# Patient Record
Sex: Female | Born: 1995 | Race: Black or African American | Hispanic: No | Marital: Single | Smoking: Never smoker
Health system: Southern US, Community
[De-identification: ages and names within clinical notes are randomized; demographics above are authoritative.]

## PROBLEM LIST (undated history)

## (undated) ENCOUNTER — Inpatient Hospital Stay (HOSPITAL_COMMUNITY): Payer: Self-pay

## (undated) DIAGNOSIS — Z789 Other specified health status: Secondary | ICD-10-CM

## (undated) HISTORY — PX: PILONIDAL CYST DRAINAGE: SHX743

## (undated) HISTORY — PX: LEG SURGERY: SHX1003

---
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
## 2015-02-02 ENCOUNTER — Emergency Department (HOSPITAL_COMMUNITY)
Admission: EM | Admit: 2015-02-02 | Discharge: 2015-02-02 | Disposition: A | Discharge status: Home / Self Care | Payer: Medicaid Other | Attending: Emergency Medicine | Admitting: Emergency Medicine

## 2015-02-02 ENCOUNTER — Encounter (HOSPITAL_COMMUNITY): Payer: Self-pay | Admitting: Emergency Medicine

## 2015-02-02 DIAGNOSIS — Z79899 Other long term (current) drug therapy: Secondary | ICD-10-CM | POA: Insufficient documentation

## 2015-02-02 DIAGNOSIS — Z872 Personal history of diseases of the skin and subcutaneous tissue: Secondary | ICD-10-CM | POA: Insufficient documentation

## 2015-02-02 DIAGNOSIS — M545 Low back pain, unspecified: Secondary | ICD-10-CM

## 2015-02-02 MED ORDER — IBUPROFEN 800 MG PO TABS
800.0000 mg | ORAL_TABLET | Freq: Three times a day (TID) | ORAL | Status: DC
Start: 2015-02-02 — End: 2015-02-17

## 2015-02-02 NOTE — ED Notes (Signed)
Pt sts hx of cyst on back and having pain to the area x several days

## 2015-02-02 NOTE — Discharge Instructions (Signed)
You were seen in the ED today for evaluation of low back sensitivity to cold. We discussed your history of multiple surgeries for pilonidal cyst. You have no visible or palpable cyst on my exam. You do not have a fever or other indication for emergent imaging and surgery. I will give you two referrals today, [facility] to establish local primary care. I will also give you a referral [facility] to establish care given your history of recurrent pilonidal cysts. Please call both of them for follow-up within one week. Return to the ED for new or concerning symptoms.

## 2015-02-02 NOTE — ED Provider Notes (Signed)
CSN: [id number]     Arrival date & time 02/02/15  1103 History  By signing my name below, I, Jodi Gonzalez, attest that this documentation has been prepared under the direction and in the presence of Jodi PennerSerena Ayomide Purdy, PA-C Electronically Signed: Charline BillsEssence Gonzalez, ED Scribe 02/02/2015 at 11:35 AM.   Chief Complaint  Patient presents with  . Cyst  . Back Pain   The history is provided by the patient. No language interpreter was used.   HPI Comments: Jodi Gonzalez is a 19 y.o. female who presents to the Emergency Department complaining of a sensitive area to her back first noticed 1 week ago. Pt suspects that she has a pilonidal cyst. She reports h/o previous pilonidal cysts in the past which she has had lanced 5 times over the past few years. She reports that the area is sensitive to cold weather and palpation. Pt reports associated chills. She denies fever, nausea, vomiting, swelling, discharge, numbness or tingling in lower extremities.   History reviewed. No pertinent past medical history. History reviewed. No pertinent past surgical history. History reviewed. No pertinent family history. Social History  Substance Use Topics  . Smoking status: Never Smoker   . Smokeless tobacco: None  . Alcohol Use: No   OB History    Gravida Para Term Preterm AB TAB SAB Ectopic Multiple Living   0 0 0 0 0 0 0 0 0 0      Review of Systems  Constitutional: Positive for chills. Negative for fever.  Gastrointestinal: Negative for nausea and vomiting.  Musculoskeletal: Positive for back pain.  Neurological: Negative for numbness.  All other systems reviewed and are negative.  Allergies  Review of patient's allergies indicates no known allergies.  Home Medications   Prior to Admission medications   Medication Sig Start Date End Date Taking? Authorizing Provider  Norgestimate-Ethinyl Estradiol Triphasic (TRINESSA, 28,) 0.18/0.215/0.25 MG-35 MCG tablet Take 1 tablet by mouth daily.    Historical  Provider, MD   BP 121/71 mmHg  Pulse 66  Temp(Src) 98.3 F (36.8 C) (Oral)  Resp 18  SpO2 99% Physical Exam  Constitutional: She is oriented to person, place, and time. She appears well-developed and well-nourished. No distress.  HENT:  Head: Normocephalic and atraumatic.  Eyes: Conjunctivae and EOM are normal.  Neck: Neck supple. No tracheal deviation present.  Cardiovascular: Normal rate.   Pulmonary/Chest: Effort normal. No respiratory distress.  Musculoskeletal: Normal range of motion. She exhibits no edema or tenderness.  No visible or palpable masses.   Neurological: She is alert and oriented to person, place, and time.  Skin: Skin is warm and dry.  Psychiatric: She has a normal mood and affect. Her behavior is normal.  Nursing note and vitals reviewed.  ED Course  Procedures (including critical care time) DIAGNOSTIC STUDIES: Oxygen Saturation is 99% on RA, normal by my interpretation.    COORDINATION OF CARE: 11:23 AM-Discussed treatment plan which includes f/u with PCP with pt at bedside and pt agreed to plan.   Labs Review Labs Reviewed - No data to display  Imaging Review No results found. I have personally reviewed and evaluated these images and lab results as part of my medical decision-making.   EKG Interpretation None      MDM   Final diagnoses:  Midline low back pain without sciatica  History of pilonidal cyst    Pt with history of multiple pilonidal cysts requiring surgery, last surgery was one year ago. Reports one week of sensitivity to cold in  the area. Denies pain at rest. Minimal tenderness on exam. Pt is afebrile, nontoxic, very well appearing. No indication for further workup in the ED. Pt recently moved to the area for college. Will give referral to [facility] to establish primary care. Will also give referral to [facility], though at this time I discussed with pt that there is no urgency in seeing surgery as I have low suspicion for current pilonidal  cyst. I suspect she has some nerve involvement from prior cysts/surgeries and might benefit from gabapentin/lyrica. ER return precautions given.    I personally performed the services described in this documentation, which was scribed in my presence. The recorded information has been reviewed and is accurate.   Carlene Coria, PA-C 02/02/15 1147  Vanetta Mulders, MD 02/03/15 0730

## 2015-02-08 ENCOUNTER — Inpatient Hospital Stay (HOSPITAL_COMMUNITY)
Admission: AD | Admit: 2015-02-08 | Discharge: 2015-02-08 | Disposition: A | Discharge status: Home / Self Care | Payer: Self-pay | Source: Ambulatory Visit | Attending: Obstetrics and Gynecology | Admitting: Obstetrics and Gynecology

## 2015-02-08 ENCOUNTER — Encounter (HOSPITAL_COMMUNITY): Payer: Self-pay

## 2015-02-08 DIAGNOSIS — R1032 Left lower quadrant pain: Secondary | ICD-10-CM | POA: Insufficient documentation

## 2015-02-08 DIAGNOSIS — Z113 Encounter for screening for infections with a predominantly sexual mode of transmission: Secondary | ICD-10-CM | POA: Insufficient documentation

## 2015-02-08 LAB — CBC
HCT: 39.4 % (ref 36.0–46.0)
Hemoglobin: 13.4 g/dL (ref 12.0–15.0)
MCH: 30.4 pg (ref 26.0–34.0)
MCHC: 34 g/dL (ref 30.0–36.0)
MCV: 89.3 fL (ref 78.0–100.0)
PLATELETS: 278 10*3/uL (ref 150–400)
RBC: 4.41 MIL/uL (ref 3.87–5.11)
RDW: 12.2 % (ref 11.5–15.5)
WBC: 9.9 10*3/uL (ref 4.0–10.5)

## 2015-02-08 LAB — URINALYSIS, ROUTINE W REFLEX MICROSCOPIC
BILIRUBIN URINE: NEGATIVE
Glucose, UA: NEGATIVE mg/dL
Hgb urine dipstick: NEGATIVE
KETONES UR: NEGATIVE mg/dL
Leukocytes, UA: NEGATIVE
NITRITE: NEGATIVE
PROTEIN: NEGATIVE mg/dL
SPECIFIC GRAVITY, URINE: 1.025 (ref 1.005–1.030)
pH: 6.5 (ref 5.0–8.0)

## 2015-02-08 LAB — WET PREP, GENITAL
Clue Cells Wet Prep HPF POC: NONE SEEN
Sperm: NONE SEEN
TRICH WET PREP: NONE SEEN
YEAST WET PREP: NONE SEEN

## 2015-02-08 LAB — POCT PREGNANCY, URINE: PREG TEST UR: NEGATIVE

## 2015-02-08 NOTE — MAU Note (Signed)
Patient presents with c/o abdominal pain on left side for about a week now.

## 2015-02-08 NOTE — Discharge Instructions (Signed)

## 2015-02-08 NOTE — MAU Provider Note (Signed)
History     CSN: [id number]  Arrival date and time: 02/08/15 1439   First Provider Initiated Contact with Patient 02/08/15 1508         Chief Complaint  Patient presents with  . Abdominal Pain   Jodi Gonzalez is a 19 y.o. female who presents for abdominal pain.   Abdominal Pain This is a new problem. The current episode started in the past 7 days. The problem occurs intermittently. The problem has been unchanged. The pain is located in the LLQ. The pain is at a severity of 7/10 (currently no pain). The quality of the pain is aching and cramping. The abdominal pain does not radiate. Pertinent negatives include no anorexia, constipation, diarrhea, dysuria, fever, frequency, hematuria, nausea or vomiting. Associated symptoms comments: No vaginal bleeding or discharge. Nothing aggravates the pain. The pain is relieved by nothing. She has tried nothing for the symptoms. There is no history of abdominal surgery, Crohn's disease or irritable bowel syndrome.    LMP 12/8. Quit taking OCP 11/24 d/t side effects & desire for pregnancy.  Also requesting STD testing at this visit.   OB History    Gravida Para Term Preterm AB TAB SAB Ectopic Multiple Living        History reviewed. No pertinent past medical history.  Past Surgical History  Procedure Laterality Date  . Leg surgery      History reviewed. No pertinent family history.  Social History  Substance Use Topics  . Smoking status: Never Smoker   . Smokeless tobacco: None  . Alcohol Use: No    Allergies: No Known Allergies  Prescriptions prior to admission  Medication Sig Dispense Refill Last Dose  . ibuprofen (ADVIL,MOTRIN) 800 MG tablet Take 1 tablet (800 mg total) by mouth 3 (three) times daily. 21 tablet 0   . Norgestimate-Ethinyl Estradiol Triphasic (TRINESSA, 28,) 0.18/0.215/0.25 MG-35 MCG tablet Take 1 tablet by mouth daily.   10/20/2014 at Unknown time    Review of Systems  Constitutional:  Negative.  Negative for fever.  Gastrointestinal: Positive for abdominal pain. Negative for nausea, vomiting, diarrhea, constipation and anorexia.  Genitourinary: Negative.  Negative for dysuria, frequency and hematuria.   Physical Exam   Blood pressure 118/77, pulse 85, temperature 98.1 F (36.7 C), temperature source Oral, resp. rate 17, SpO2 99 %.  Physical Exam  Nursing note and vitals reviewed. Constitutional: She is oriented to person, place, and time. She appears well-developed and well-nourished. No distress.  HENT:  Head: Normocephalic and atraumatic.  Eyes: Conjunctivae are normal. Right eye exhibits no discharge. Left eye exhibits no discharge. No scleral icterus.  Neck: Normal range of motion.  Cardiovascular: Normal rate, regular rhythm and normal heart sounds.   No murmur heard. Respiratory: Effort normal and breath sounds normal. No respiratory distress. She has no wheezes.  GI: Soft. Bowel sounds are normal. She exhibits no distension and no mass. There is no tenderness. There is no rebound and no guarding.  Genitourinary: Vagina normal and uterus normal. Cervix exhibits discharge (small amount of thin white discharge). Cervix exhibits no motion tenderness and no friability. Right adnexum displays no mass, no tenderness and no fullness. Left adnexum displays no mass, no tenderness and no fullness.  Neurological: She is alert and oriented to person, place, and time.  Skin: Skin is warm and dry. She is not diaphoretic.  Psychiatric: She has a normal mood and affect. Her behavior is normal. Judgment and  thought content normal.    MAU Course  Procedures Results for orders placed or performed during the hospital encounter of 02/08/15 (from the past 24 hour(s))  Urinalysis, Routine w reflex microscopic (not at [facility])     Status: None   Collection Time: 02/08/15  2:39 PM  Result Value Ref Range   Color, Urine YELLOW YELLOW   APPearance CLEAR CLEAR   Specific Gravity, Urine  1.025 1.005 - 1.030   pH 6.5 5.0 - 8.0   Glucose, UA NEGATIVE NEGATIVE mg/dL   Hgb urine dipstick NEGATIVE NEGATIVE   Bilirubin Urine NEGATIVE NEGATIVE   Ketones, ur NEGATIVE NEGATIVE mg/dL   Protein, ur NEGATIVE NEGATIVE mg/dL   Nitrite NEGATIVE NEGATIVE   Leukocytes, UA NEGATIVE NEGATIVE  Pregnancy, urine POC     Status: None   Collection Time: 02/08/15  2:56 PM  Result Value Ref Range   Preg Test, Ur NEGATIVE NEGATIVE  CBC     Status: None   Collection Time: 02/08/15  3:26 PM  Result Value Ref Range   WBC 9.9 4.0 - 10.5 K/uL   RBC 4.41 3.87 - 5.11 MIL/uL   Hemoglobin 13.4 12.0 - 15.0 g/dL   HCT 82.939.4 56.236.0 - 13.046.0 %   MCV 89.3 78.0 - 100.0 fL   MCH 30.4 26.0 - 34.0 pg   MCHC 34.0 30.0 - 36.0 g/dL   RDW 86.512.2 78.411.5 - 69.615.5 %   Platelets 278 150 - 400 K/uL  Wet prep, genital     Status: Abnormal   Collection Time: 02/08/15  3:43 PM  Result Value Ref Range   Yeast Wet Prep HPF POC NONE SEEN NONE SEEN   Trich, Wet Prep NONE SEEN NONE SEEN   Clue Cells Wet Prep HPF POC NONE SEEN NONE SEEN   WBC, Wet Prep HPF POC MODERATE (A) NONE SEEN   Sperm NONE SEEN     MDM UPT negative STD screening per patient request Pt in no pain during MAU visit Assessment and Plan  A: 1. Left lower quadrant pain   2. Screen for STD (sexually transmitted disease)    P: Discharge home Take tylenol or motrin PRN pain If symptoms worsen go to urgent care or [facility] for [facility] care GC/CT, RPR, HIV pending   Judeth HornErin Lachell Rochette, NP  02/08/2015, 3:07 PM

## 2015-02-09 LAB — HIV ANTIBODY (ROUTINE TESTING W REFLEX): HIV SCREEN 4TH GENERATION: NONREACTIVE

## 2015-02-09 LAB — RPR: RPR Ser Ql: NONREACTIVE

## 2015-02-12 LAB — GC/CHLAMYDIA PROBE AMP (~~LOC~~) NOT AT ARMC
Chlamydia: NEGATIVE
NEISSERIA GONORRHEA: NEGATIVE

## 2015-02-17 ENCOUNTER — Encounter (HOSPITAL_COMMUNITY): Payer: Self-pay

## 2015-02-17 ENCOUNTER — Inpatient Hospital Stay (HOSPITAL_COMMUNITY)
Admission: AD | Admit: 2015-02-17 | Discharge: 2015-02-17 | Disposition: A | Discharge status: Home / Self Care | Payer: Medicaid Other | Source: Ambulatory Visit | Attending: Obstetrics & Gynecology | Admitting: Obstetrics & Gynecology

## 2015-02-17 DIAGNOSIS — Z3201 Encounter for pregnancy test, result positive: Secondary | ICD-10-CM | POA: Diagnosis present

## 2015-02-17 LAB — URINALYSIS, ROUTINE W REFLEX MICROSCOPIC
BILIRUBIN URINE: NEGATIVE
Glucose, UA: NEGATIVE mg/dL
Hgb urine dipstick: NEGATIVE
Ketones, ur: NEGATIVE mg/dL
LEUKOCYTES UA: NEGATIVE
NITRITE: NEGATIVE
PROTEIN: NEGATIVE mg/dL
Specific Gravity, Urine: 1.025 (ref 1.005–1.030)
pH: 6 (ref 5.0–8.0)

## 2015-02-17 LAB — POCT PREGNANCY, URINE: Preg Test, Ur: POSITIVE — AB

## 2015-02-17 MED ORDER — PRENATAL VITAMINS PLUS 27-1 MG PO TABS: 1.0000 | ORAL_TABLET | Freq: Every day | ORAL | Status: DC

## 2015-02-17 NOTE — MAU Note (Signed)
Was here a week ago, neg preg test, +HPT today.  Had some pinkish spotting on Monday.  Has been having cramping all week, feels like period is going to start

## 2015-02-17 NOTE — MAU Provider Note (Signed)
  History     CSN: [id number]  Arrival date and time: 02/17/15 1730   First Provider Initiated Contact with Patient 02/17/15 1831      Chief Complaint  Patient presents with  . Possible Pregnancy  . Abdominal Pain  . Vaginal Bleeding   HPI Jodi Gonzalez 20 y.o. G1P0000 @[redacted]w[redacted]d  presents for confirmation that everything is okay with the pregnancy.  She took a home pregnancy test today and it was positive. Last week, she had a test here that was negative.  (She came last week for cramping.)  She had a small amount of spotting one time on 02/15/15 and this concerns her now that she knows she is pregnant.  She reports she had IC the night before.  She denies fever, nausea, vomiting, dysuria, vaginal bleeding, discharge.  OB History    Gravida Para Term Preterm AB TAB SAB Ectopic Multiple Living   1 0 0 0 0 0 0 0 0 0       History reviewed. No pertinent past medical history.  Past Surgical History  Procedure Laterality Date  . Leg surgery    . Pilonidal cyst drainage      History reviewed. No pertinent family history.  Social History  Substance Use Topics  . Smoking status: Never Smoker   . Smokeless tobacco: None  . Alcohol Use: No    Allergies:  Allergies  Allergen Reactions  . Oatmeal Rash    Prescriptions prior to admission  Medication Sig Dispense Refill Last Dose  . Aspirin-Acetaminophen-Caffeine (PAMPRIN MAX PO) Take 2 tablets by mouth daily as needed (For cramps.).   02/16/2015 at Unknown time  . cycloSPORINE (RESTASIS) 0.05 % ophthalmic emulsion Place 1 drop into both eyes 2 (two) times daily.   Past Week at Unknown time  . ibuprofen (ADVIL,MOTRIN) 800 MG tablet Take 1 tablet (800 mg total) by mouth 3 (three) times daily. (Patient not taking: Reported on 02/08/2015) 21 tablet 0 Not Taking at Unknown time    ROS Pertinent ROS in HPI.  All other systems are negative.    Physical Exam   Blood pressure 138/82, pulse 70, temperature 97.8 F (36.6 C), temperature  source Oral, resp. rate 18, height 5' 2.75" (1.594 m), weight 172 lb (78.019 kg), last menstrual period 01/21/2015.  Physical Exam  Constitutional: She is oriented to person, place, and time. She appears well-developed and well-nourished. No distress.  HENT:  Head: Normocephalic and atraumatic.  Eyes: Conjunctivae and EOM are normal.  Neck: Normal range of motion. Neck supple.  Cardiovascular: Normal rate and normal heart sounds.   Respiratory: Effort normal. No respiratory distress.  GI: Soft. She exhibits no distension. There is no tenderness. There is no rebound and no guarding.  Neurological: She is alert and oriented to person, place, and time.  Skin: Skin is warm and dry.  Psychiatric: She has a normal mood and affect. Her behavior is normal.    MAU Course  Procedures  MDM Discussed with Dr. Despina HiddenEure whom agrees no further workup required.    Assessment and Plan  A:  1. Encounter for pregnancy test, result positive     P: Discharge to home [facility] verification letter given List of ob providers given OTC safe meds given Patient may return to MAU as needed or if her condition were to change or worsen   Bertram DenverKaren E Teague Clark 02/17/2015, 6:36 PM

## 2015-02-17 NOTE — Discharge Instructions (Signed)
Prenatal Care °WHAT IS PRENATAL CARE?  °Prenatal care is the process of caring for a pregnant woman before she gives birth. Prenatal care makes sure that she and her baby remain as healthy as possible throughout pregnancy. Prenatal care may be provided by a midwife, family practice health care provider, or a childbirth and pregnancy specialist (obstetrician). Prenatal care may include physical examinations, testing, treatments, and education on nutrition, lifestyle, and social support services. °WHY IS PRENATAL CARE SO IMPORTANT?  °Early and consistent prenatal care increases the chance that you and your baby will remain healthy throughout your pregnancy. This type of care also decreases a baby's risk of being born too early (prematurely), or being born smaller than expected (small for gestational age). Any underlying medical conditions you may have that could pose a risk during your pregnancy are discussed during prenatal care visits. You will also be monitored regularly for any new conditions that may arise during your pregnancy so they can be treated quickly and effectively. °WHAT HAPPENS DURING PRENATAL CARE VISITS? °Prenatal care visits may include the following: °Discussion °Tell your health care provider about any new signs or symptoms you have experienced since your last visit. These might include: °· Nausea or vomiting. °· Increased or decreased level of energy. °· Difficulty sleeping. °· Back or leg pain. °· Weight changes. °· Frequent urination. °· Shortness of breath with physical activity. °· Changes in your skin, such as the development of a rash or itchiness. °· Vaginal discharge or bleeding. °· Feelings of excitement or nervousness. °· Changes in your baby's movements. °You may want to write down any questions or topics you want to discuss with your health care provider and bring them with you to your appointment. °Examination °During your first prenatal care visit, you will likely have a complete  physical exam. Your health care provider will often examine your vagina, cervix, and the position of your uterus, as well as check your heart, lungs, and other body systems. As your pregnancy progresses, your health care provider will measure the size of your uterus and your baby's position inside your uterus. He or she may also examine you for early signs of labor. Your prenatal visits may also include checking your blood pressure and, after about 10-12 weeks of pregnancy, listening to your baby's heartbeat. °Testing °Regular testing often includes: °· Urinalysis. This checks your urine for glucose, protein, or signs of infection. °· Blood count. This checks the levels of white and red blood cells in your body. °· Tests for sexually transmitted infections (STIs). Testing for STIs at the beginning of pregnancy is routinely done and is required in many states. °· Antibody testing. You will be checked to see if you are immune to certain illnesses, such as rubella, that can affect a developing fetus. °· Glucose screen. Around 24-28 weeks of pregnancy, your blood glucose level will be checked for signs of gestational diabetes. Follow-up tests may be recommended. °· Group B strep. This is a bacteria that is commonly found inside a woman's vagina. This test will inform your health care provider if you need an antibiotic to reduce the amount of this bacteria in your body prior to labor and childbirth. °· Ultrasound. Many pregnant women undergo an ultrasound screening around 18-20 weeks of pregnancy to evaluate the health of the fetus and check for any developmental abnormalities. °· HIV (human immunodeficiency virus) testing. Early in your pregnancy, you will be screened for HIV. If you are at high risk for HIV, this test   may be repeated during your third trimester of pregnancy. °You may be offered other testing based on your age, personal or family medical history, or other factors.  °HOW OFTEN SHOULD I PLAN TO SEE MY  HEALTH CARE PROVIDER FOR PRENATAL CARE? °Your prenatal care check-up schedule depends on any medical conditions you have before, or develop during, your pregnancy. If you do not have any underlying medical conditions, you will likely be seen for checkups: °· Monthly, during the first 6 months of pregnancy. °· Twice a month during months 7 and 8 of pregnancy. °· Weekly starting in the 9th month of pregnancy and until delivery. °If you develop signs of early labor or other concerning signs or symptoms, you may need to see your health care provider more often. Ask your health care provider what prenatal care schedule is best for you. °WHAT CAN I DO TO KEEP MYSELF AND MY BABY AS HEALTHY AS POSSIBLE DURING MY PREGNANCY? °· Take a prenatal vitamin containing 400 micrograms (0.4 mg) of folic acid every day. Your health care provider may also ask you to take additional vitamins such as iodine, vitamin D, iron, copper, and zinc. °· Take 1500-2000 mg of calcium daily starting at your 20th week of pregnancy until you deliver your baby. °· Make sure you are up to date on your vaccinations. Unless directed otherwise by your health care provider: °¨ You should receive a tetanus, diphtheria, and pertussis (Tdap) vaccination between the 27th and 36th week of your pregnancy, regardless of when your last Tdap immunization occurred. This helps protect your baby from whooping cough (pertussis) after he or she is born. °¨ You should receive an annual inactivated influenza vaccine (IIV) to help protect you and your baby from influenza. This can be done at any point during your pregnancy. °· Eat a well-rounded diet that includes: °¨ Fresh fruits and vegetables. °¨ Lean proteins. °¨ Calcium-rich foods such as milk, yogurt, hard cheeses, and dark, leafy greens. °¨ Whole grain breads. °· Do not eat seafood high in mercury, including: °¨ Swordfish. °¨ Tilefish. °¨ Shark. °¨ King mackerel. °¨ More than 6 oz tuna per week. °· Do not eat: °¨ Raw  or undercooked meats or eggs. °¨ Unpasteurized foods, such as soft cheeses (brie, blue, or feta), juices, and milks. °¨ Lunch meats. °¨ Hot dogs that have not been heated until they are steaming. °· Drink enough water to keep your urine clear or pale yellow. For many women, this may be 10 or more 8 oz glasses of water each day. Keeping yourself hydrated helps deliver nutrients to your baby and may prevent the start of pre-term uterine contractions. °· Do not use any tobacco products including cigarettes, chewing tobacco, or electronic cigarettes. If you need help quitting, ask your health care provider. °· Do not drink beverages containing alcohol. No safe level of alcohol consumption during pregnancy has been determined. °· Do not use any illegal drugs. These can harm your developing baby or cause a miscarriage. °· Ask your health care provider or pharmacist before taking any prescription or over-the-counter medicines, herbs, or supplements. °· Limit your caffeine intake to no more than 200 mg per day. °· Exercise. Unless told otherwise by your health care provider, try to get 30 minutes of moderate exercise most days of the week. Do not  do high-impact activities, contact sports, or activities with a high risk of falling, such as horseback riding or downhill skiing. °· Get plenty of rest. °· Avoid anything that raises your   body temperature, such as hot tubs and saunas. °· If you own a cat, do not empty its litter box. Bacteria contained in cat feces can cause an infection called toxoplasmosis. This can result in serious harm to the fetus. °· Stay away from chemicals such as insecticides, lead, mercury, and cleaning or paint products that contain solvents. °· Do not have any X-rays taken unless medically necessary. °· Take a childbirth and breastfeeding preparation class. Ask your health care provider if you need a referral or recommendation. °  °This information is not intended to replace advice given to you by  your health care provider. Make sure you discuss any questions you have with your health care provider. °  °Document Released: 02/02/2003 Document Revised: 02/20/2014 Document Reviewed: 04/16/2013 °Elsevier Interactive Patient Education ©2016 [employer] ° °

## 2015-02-18 NOTE — MAU Note (Signed)
Proof of pregnancy letter for pt

## 2015-02-27 ENCOUNTER — Inpatient Hospital Stay (HOSPITAL_COMMUNITY): Payer: Medicaid Other

## 2015-02-27 ENCOUNTER — Encounter (HOSPITAL_COMMUNITY): Payer: Self-pay | Admitting: Student

## 2015-02-27 ENCOUNTER — Inpatient Hospital Stay (HOSPITAL_COMMUNITY)
Admission: AD | Admit: 2015-02-27 | Discharge: 2015-02-28 | Disposition: A | Discharge status: Home / Self Care | Payer: Medicaid Other | Source: Ambulatory Visit | Attending: Obstetrics & Gynecology | Admitting: Obstetrics & Gynecology

## 2015-02-27 DIAGNOSIS — Z3A01 Less than 8 weeks gestation of pregnancy: Secondary | ICD-10-CM | POA: Insufficient documentation

## 2015-02-27 DIAGNOSIS — O26899 Other specified pregnancy related conditions, unspecified trimester: Secondary | ICD-10-CM

## 2015-02-27 DIAGNOSIS — Z3491 Encounter for supervision of normal pregnancy, unspecified, first trimester: Secondary | ICD-10-CM

## 2015-02-27 DIAGNOSIS — O468X1 Other antepartum hemorrhage, first trimester: Secondary | ICD-10-CM

## 2015-02-27 DIAGNOSIS — R1030 Lower abdominal pain, unspecified: Secondary | ICD-10-CM | POA: Insufficient documentation

## 2015-02-27 DIAGNOSIS — O209 Hemorrhage in early pregnancy, unspecified: Secondary | ICD-10-CM | POA: Insufficient documentation

## 2015-02-27 DIAGNOSIS — R109 Unspecified abdominal pain: Secondary | ICD-10-CM

## 2015-02-27 DIAGNOSIS — O418X10 Other specified disorders of amniotic fluid and membranes, first trimester, not applicable or unspecified: Secondary | ICD-10-CM

## 2015-02-27 LAB — CBC
HCT: 40.8 % (ref 36.0–46.0)
Hemoglobin: 14 g/dL (ref 12.0–15.0)
MCH: 30.4 pg (ref 26.0–34.0)
MCHC: 34.3 g/dL (ref 30.0–36.0)
MCV: 88.7 fL (ref 78.0–100.0)
PLATELETS: 271 10*3/uL (ref 150–400)
RBC: 4.6 MIL/uL (ref 3.87–5.11)
RDW: 12.5 % (ref 11.5–15.5)
WBC: 13.2 10*3/uL — ABNORMAL HIGH (ref 4.0–10.5)

## 2015-02-27 LAB — HCG, QUANTITATIVE, PREGNANCY: HCG, BETA CHAIN, QUANT, S: 22198 m[IU]/mL — AB (ref <5)

## 2015-02-27 NOTE — MAU Note (Signed)
Having vag bleeding like a period about an hour ago. Mild cramping.

## 2015-02-27 NOTE — MAU Provider Note (Signed)
History     CSN: [id number]  Arrival date and time: 02/27/15 2223   First Provider Initiated Contact with Patient 02/27/15 2357       Chief Complaint  Patient presents with  . Abdominal Cramping  . Vaginal Bleeding   HPI  Jodi Gonzalez is a 20 y.o. G1P0000 at [redacted]w[redacted]d who presents with vaginal bleeding & abdominal cramping.  Symptoms started tonight. Reports episode of gush of bright red blood. Bleeding has decreased by continued. No clots. Lower abdominal pain is intermittent. Rates 5/10. No treatment.  Denies n/v/d, constipation, vaginal discharge, urinary complaints.  Last intercourse was 2 days ago.   OB History    Gravida Para Term Preterm AB TAB SAB Ectopic Multiple Living   1 0 0 0 0 0 0 0 0 0       No past medical history on file.  Past Surgical History  Procedure Laterality Date  . Leg surgery    . Pilonidal cyst drainage      No family history on file.  Social History  Substance Use Topics  . Smoking status: Never Smoker   . Smokeless tobacco: Not on file  . Alcohol Use: No    Allergies:  Allergies  Allergen Reactions  . Oatmeal Rash    Prescriptions prior to admission  Medication Sig Dispense Refill Last Dose  . Prenatal Vit-Fe Fumarate-FA (PRENATAL VITAMINS PLUS) 27-1 MG TABS Take 1 tablet by mouth daily. 30 tablet 11     Review of Systems  Constitutional: Negative.   Gastrointestinal: Positive for abdominal pain. Negative for nausea, vomiting, diarrhea and constipation.  Genitourinary: Negative for dysuria.       + vaginal bleeding   Physical Exam   Blood pressure 125/71, pulse 76, temperature 97.6 F (36.4 C), resp. rate 18, height 5\' 3"  (1.6 m), weight 175 lb 9.6 oz (79.652 kg), last menstrual period 01/21/2015.  Physical Exam  Nursing note and vitals reviewed. Constitutional: She is oriented to person, place, and time. She appears well-developed and well-nourished. No distress.  HENT:  Head: Normocephalic and atraumatic.  Eyes:  Conjunctivae are normal. Right eye exhibits no discharge. Left eye exhibits no discharge. No scleral icterus.  Neck: Normal range of motion.  Cardiovascular: Normal rate, regular rhythm and normal heart sounds.   No murmur heard. Respiratory: Effort normal and breath sounds normal. No respiratory distress. She has no wheezes.  GI: Soft. Bowel sounds are normal. She exhibits no distension. There is no tenderness.  Genitourinary: Vagina normal and uterus normal. Right adnexum displays no mass and no tenderness. Left adnexum displays no mass and no tenderness.  Small amount of dark red blood Cervix closed  Neurological: She is alert and oriented to person, place, and time.  Skin: Skin is warm and dry. She is not diaphoretic.  Psychiatric: She has a normal mood and affect. Her behavior is normal. Judgment and thought content normal.    MAU Course  Procedures Results for orders placed or performed during the hospital encounter of 02/27/15 (from the past 24 hour(s))  CBC     Status: Abnormal   Collection Time: 02/27/15 10:38 PM  Result Value Ref Range   WBC 13.2 (H) 4.0 - 10.5 K/uL   RBC 4.60 3.87 - 5.11 MIL/uL   Hemoglobin 14.0 12.0 - 15.0 g/dL   HCT 40.9 81.1 - 91.4 %   MCV 88.7 78.0 - 100.0 fL   MCH 30.4 26.0 - 34.0 pg   MCHC 34.3 30.0 - 36.0 g/dL  RDW 12.5 11.5 - 15.5 %   Platelets 271 150 - 400 K/uL  ABO/Rh     Status: None   Collection Time: 02/27/15 10:38 PM  Result Value Ref Range   ABO/RH(D) O POS   hCG, quantitative, pregnancy     Status: Abnormal   Collection Time: 02/27/15 10:38 PM  Result Value Ref Range   hCG, Beta Chain, Quant, S 22198 (H) <5 mIU/mL  Wet prep, genital     Status: Abnormal   Collection Time: 02/28/15 12:20 AM  Result Value Ref Range   Yeast Wet Prep HPF POC NONE SEEN NONE SEEN   Trich, Wet Prep NONE SEEN NONE SEEN   Clue Cells Wet Prep HPF POC NONE SEEN NONE SEEN   WBC, Wet Prep HPF POC FEW (A) NONE SEEN   Sperm NONE SEEN    US Ob Comp Less 14  Wks  02/27/2015  CLINICAL DATA:  Pregnancy, abdominal pain, bleeding, cramping ; quantitative beta HCG not currently available for correlation EXAM: OBSTETRIC <14 WK [place] AND TRANSVAGINAL OB US TECHNIQUE: Both transabdominal and transvaginal ultrasound examinations were performed for complete evaluation of the gestation as well as the maternal uterus, adnexal regions, and pelvic cul-de-sac. Transvaginal technique was performed to assess early pregnancy. COMPARISON:  None FINDINGS: Intrauterine gestational sac: Visualized/normal in shape. Yolk sac:  Present Embryo:  Present Cardiac Activity: Present Heart Rate: 106  bpm MSD:   mm    w     d CRL:  4.3  mm   6 w   1 d                  [place] EDC: 10/22/2015 Subchorionic hemorrhage: Present, 2 foci measuring 13 x 5 x 8 mm and 15 x 10 x 15 mm. Maternal uterus/adnexae: No free pelvic fluid. RIGHT ovary normal size and morphology 3.9 x 2.9 x 2.2 cm. LEFT ovary normal size and morphology 3.4 x 2.2 x 1.7 cm. No adnexal masses. IMPRESSION: Single live intrauterine gestation measured at 6 weeks 1 day EGA by crown-rump length. Two foci of subchorionic hemorrhage are identified as above. Electronically Signed   By: Ulyses Southward M.D.   On: 02/27/2015 23:32   US Ob Transvaginal  02/27/2015  CLINICAL DATA:  Pregnancy, abdominal pain, bleeding, cramping ; quantitative beta HCG not currently available for correlation EXAM: OBSTETRIC <14 WK [place] AND TRANSVAGINAL OB US TECHNIQUE: Both transabdominal and transvaginal ultrasound examinations were performed for complete evaluation of the gestation as well as the maternal uterus, adnexal regions, and pelvic cul-de-sac. Transvaginal technique was performed to assess early pregnancy. COMPARISON:  None FINDINGS: Intrauterine gestational sac: Visualized/normal in shape. Yolk sac:  Present Embryo:  Present Cardiac Activity: Present Heart Rate: 106  bpm MSD:   mm    w     d CRL:  4.3  mm   6 w   1 d                  [place] EDC: 10/22/2015 Subchorionic  hemorrhage: Present, 2 foci measuring 13 x 5 x 8 mm and 15 x 10 x 15 mm. Maternal uterus/adnexae: No free pelvic fluid. RIGHT ovary normal size and morphology 3.9 x 2.9 x 2.2 cm. LEFT ovary normal size and morphology 3.4 x 2.2 x 1.7 cm. No adnexal masses. IMPRESSION: Single live intrauterine gestation measured at 6 weeks 1 day EGA by crown-rump length. Two foci of subchorionic hemorrhage are identified as above. Electronically Signed   By: Loraine Leriche  Tyron RussellBoles M.D.   On: 02/27/2015 23:32    MDM O positive Ultrasound show [facility] with cardiac [facility]  Assessment and Plan  A: 1. Normal IUP (intrauterine pregnancy) on prenatal ultrasound, first trimester   2. Abdominal pain in pregnancy   3. Subchorionic hematoma in first trimester    P: Discharge home Start prenatal care Pelvic rest while bleeding Discussed reasons to return  Judeth HornErin Teosha Casso, NP  02/27/2015, 11:57 PM

## 2015-02-28 ENCOUNTER — Encounter (HOSPITAL_COMMUNITY): Payer: Self-pay | Admitting: *Deleted

## 2015-02-28 DIAGNOSIS — Z3491 Encounter for supervision of normal pregnancy, unspecified, first trimester: Secondary | ICD-10-CM

## 2015-02-28 LAB — HIV ANTIBODY (ROUTINE TESTING W REFLEX): HIV SCREEN 4TH GENERATION: NONREACTIVE

## 2015-02-28 LAB — WET PREP, GENITAL
CLUE CELLS WET PREP: NONE SEEN
Sperm: NONE SEEN
Trich, Wet Prep: NONE SEEN
Yeast Wet Prep HPF POC: NONE SEEN

## 2015-02-28 LAB — ABO/RH: ABO/RH(D): O POS

## 2015-02-28 MED ORDER — PRENATAL VITAMIN 27-0.8 MG PO TABS: 1.0000 | ORAL_TABLET | Freq: Every day | ORAL | Status: DC

## 2015-02-28 NOTE — Progress Notes (Signed)
Written and verbal d/c instructions given and understanding voiced. 

## 2015-02-28 NOTE — Discharge Instructions (Signed)
Subchorionic Hematoma A subchorionic hematoma is a gathering of blood between the outer wall of the placenta and the inner wall of the womb (uterus). The placenta is the organ that connects the fetus to the wall of the uterus. The placenta performs the feeding, breathing (oxygen to the fetus), and waste removal (excretory work) of the fetus.  Subchorionic hematoma is the most common abnormality found on a result from ultrasonography done during the first trimester or early second trimester of pregnancy. If there has been little or no vaginal bleeding, early small hematomas usually shrink on their own and do not affect your baby or pregnancy. The blood is gradually absorbed over 1-2 weeks. When bleeding starts later in pregnancy or the hematoma is larger or occurs in an older pregnant woman, the outcome may not be as good. Larger hematomas may get bigger, which increases the chances for miscarriage. Subchorionic hematoma also increases the risk of premature detachment of the placenta from the uterus, preterm (premature) labor, and stillbirth. HOME CARE INSTRUCTIONS  Stay on bed rest if your health care provider recommends this. Although bed rest will not prevent more bleeding or prevent a miscarriage, your health care provider may recommend bed rest until you are advised otherwise.  Avoid heavy lifting (more than 10 lb [4.5 kg]), exercise, sexual intercourse, or douching as directed by your health care provider.  Keep track of the number of pads you use each day and how soaked (saturated) they are. Write down this information.  Do not use tampons.  Keep all follow-up appointments as directed by your health care provider. Your health care provider may ask you to have follow-up blood tests or ultrasound tests or both. SEEK IMMEDIATE MEDICAL CARE IF:  You have severe cramps in your stomach, back, abdomen, or pelvis.  You have a fever.  You pass large clots or tissue. Save any tissue for your health  care provider to look at.  Your bleeding increases or you become lightheaded, feel weak, or have fainting episodes.   This information is not intended to replace advice given to you by your health care provider. Make sure you discuss any questions you have with your health care provider.   Document Released: 05/17/2006 Document Revised: 02/20/2014 Document Reviewed: 08/29/2012 Elsevier Interactive Patient Education 2016 [employer] First Trimester of Pregnancy The first trimester of pregnancy is from week 1 until the end of week 12 (months 1 through 3). A week after a sperm fertilizes an egg, the egg will implant on the wall of the uterus. This embryo will begin to develop into a baby. Genes from you and your partner are forming the baby. The female genes determine whether the baby is a boy or a girl. At 6-8 weeks, the eyes and face are formed, and the heartbeat can be seen on ultrasound. At the end of 12 weeks, all the baby's organs are formed.  Now that you are pregnant, you will want to do everything you can to have a healthy baby. Two of the most important things are to get good prenatal care and to follow your health care provider's instructions. Prenatal care is all the medical care you receive before the baby's birth. This care will help prevent, find, and treat any problems during the pregnancy and childbirth. BODY CHANGES Your body goes through many changes during pregnancy. The changes vary from woman to woman.   You may gain or lose a couple of pounds at first.  You may feel sick to your   stomach (nauseous) and throw up (vomit). If the vomiting is uncontrollable, call your health care provider.  You may tire easily.  You may develop headaches that can be relieved by medicines approved by your health care provider.  You may urinate more often. Painful urination may mean you have a bladder infection.  You may develop heartburn as a result of your pregnancy.  You may develop  constipation because certain hormones are causing the muscles that push waste through your intestines to slow down.  You may develop hemorrhoids or swollen, bulging veins (varicose veins).  Your breasts may begin to grow larger and become tender. Your nipples may stick out more, and the tissue that surrounds them (areola) may become darker.  Your gums may bleed and may be sensitive to brushing and flossing.  Dark spots or blotches (chloasma, mask of pregnancy) may develop on your face. This will likely fade after the baby is born.  Your menstrual periods will stop.  You may have a loss of appetite.  You may develop cravings for certain kinds of food.  You may have changes in your emotions from day to day, such as being excited to be pregnant or being concerned that something may go wrong with the pregnancy and baby.  You may have more vivid and strange dreams.  You may have changes in your hair. These can include thickening of your hair, rapid growth, and changes in texture. Some women also have hair loss during or after pregnancy, or hair that feels dry or thin. Your hair will most likely return to normal after your baby is born. WHAT TO EXPECT AT YOUR PRENATAL VISITS During a routine prenatal visit:  You will be weighed to make sure you and the baby are growing normally.  Your blood pressure will be taken.  Your abdomen will be measured to track your baby's growth.  The fetal heartbeat will be listened to starting around week 10 or 12 of your pregnancy.  Test results from any previous visits will be discussed. Your health care provider may ask you:  How you are feeling.  If you are feeling the baby move.  If you have had any abnormal symptoms, such as leaking fluid, bleeding, severe headaches, or abdominal cramping.  If you are using any tobacco products, including cigarettes, chewing tobacco, and electronic cigarettes.  If you have any questions. Other tests that may be  performed during your first trimester include:  Blood tests to find your blood type and to check for the presence of any previous infections. They will also be used to check for low iron levels (anemia) and Rh antibodies. Later in the pregnancy, blood tests for diabetes will be done along with other tests if problems develop.  Urine tests to check for infections, diabetes, or protein in the urine.  An ultrasound to confirm the proper growth and development of the baby.  An amniocentesis to check for possible genetic problems.  Fetal screens for spina bifida and Down syndrome.  You may need other tests to make sure you and the baby are doing well.  HIV (human immunodeficiency virus) testing. Routine prenatal testing includes screening for HIV, unless you choose not to have this test. HOME CARE INSTRUCTIONS  Medicines  Follow your health care provider's instructions regarding medicine use. Specific medicines may be either safe or unsafe to take during pregnancy.  Take your prenatal vitamins as directed.  If you develop constipation, try taking a stool softener if your health care provider   health care provider approves. Diet  Eat regular, well-balanced meals. Choose a variety of foods, such as meat or vegetable-based protein, fish, milk and low-fat dairy products, vegetables, fruits, and whole grain breads and cereals. Your health care provider will help you determine the amount of weight gain that is right for you.  Avoid raw meat and uncooked cheese. These carry germs that can cause birth defects in the baby.  Eating four or five small meals rather than three large meals a day may help relieve nausea and vomiting. If you start to feel nauseous, eating a few soda crackers can be helpful. Drinking liquids between meals instead of during meals also seems to help nausea and vomiting.  If you develop constipation, eat more high-fiber foods, such as fresh vegetables or fruit and whole grains. Drink enough fluids  to keep your urine clear or pale yellow. Activity and Exercise  Exercise only as directed by your health care provider. Exercising will help you:  Control your weight.  Stay in shape.  Be prepared for labor and delivery.  Experiencing pain or cramping in the lower abdomen or low back is a good sign that you should stop exercising. Check with your health care provider before continuing normal exercises.  Try to avoid standing for long periods of time. Move your legs often if you must stand in one place for a long time.  Avoid heavy lifting.  Wear low-heeled shoes, and practice good posture.  You may continue to have sex unless your health care provider directs you otherwise. Relief of Pain or Discomfort  Wear a good support bra for breast tenderness.   Take warm sitz baths to soothe any pain or discomfort caused by hemorrhoids. Use hemorrhoid cream if your health care provider approves.   Rest with your legs elevated if you have leg cramps or low back pain.  If you develop varicose veins in your legs, wear support hose. Elevate your feet for 15 minutes, 3-4 times a day. Limit salt in your diet. Prenatal Care  Schedule your prenatal visits by the twelfth week of pregnancy. They are usually scheduled monthly at first, then more often in the last 2 months before delivery.  Write down your questions. Take them to your prenatal visits.  Keep all your prenatal visits as directed by your health care provider. Safety  Wear your seat belt at all times when driving.  Make a list of emergency phone numbers, including numbers for family, friends, the hospital, and police and fire departments. General Tips  Ask your health care provider for a referral to a local prenatal education class. Begin classes no later than at the beginning of month 6 of your pregnancy.  Ask for help if you have counseling or nutritional needs during pregnancy. Your health care provider can offer advice or  refer you to specialists for help with various needs.  Do not use hot tubs, steam rooms, or saunas.  Do not douche or use tampons or scented sanitary pads.  Do not cross your legs for long periods of time.  Avoid cat litter boxes and soil used by cats. These carry germs that can cause birth defects in the baby and possibly loss of the fetus by miscarriage or stillbirth.  Avoid all smoking, herbs, alcohol, and medicines not prescribed by your health care provider. Chemicals in these affect the formation and growth of the baby.  Do not use any tobacco products, including cigarettes, chewing tobacco, and electronic cigarettes. If you need help  health care provider. You may receive counseling support and other resources to help you quit.  Schedule a dentist appointment. At home, brush your teeth with a soft toothbrush and be gentle when you floss. SEEK MEDICAL CARE IF:   You have dizziness.  You have mild pelvic cramps, pelvic pressure, or nagging pain in the abdominal area.  You have persistent nausea, vomiting, or diarrhea.  You have a bad smelling vaginal discharge.  You have pain with urination.  You notice increased swelling in your face, hands, legs, or ankles. SEEK IMMEDIATE MEDICAL CARE IF:   You have a fever.  You are leaking fluid from your vagina.  You have spotting or bleeding from your vagina.  You have severe abdominal cramping or pain.  You have rapid weight gain or loss.  You vomit blood or material that looks like coffee grounds.  You are exposed to German measles and have never had them.  You are exposed to fifth disease or chickenpox.  You develop a severe headache.  You have shortness of breath.  You have any kind of trauma, such as from a fall or a car accident.   This information is not intended to replace advice given to you by your health care provider. Make sure you discuss any questions you have with your health care  provider.   Document Released: 01/24/2001 Document Revised: 02/20/2014 Document Reviewed: 12/10/2012 Elsevier Interactive Patient Education 2016 [employer] Inc[employer]  

## 2015-03-01 LAB — GC/CHLAMYDIA PROBE AMP (~~LOC~~) NOT AT ARMC
Chlamydia: NEGATIVE
Neisseria Gonorrhea: NEGATIVE

## 2015-03-18 ENCOUNTER — Other Ambulatory Visit (HOSPITAL_COMMUNITY): Payer: Self-pay | Admitting: Nurse Practitioner

## 2015-03-18 DIAGNOSIS — Z3682 Encounter for antenatal screening for nuchal translucency: Secondary | ICD-10-CM

## 2015-03-18 DIAGNOSIS — Z3A13 13 weeks gestation of pregnancy: Secondary | ICD-10-CM

## 2015-04-07 ENCOUNTER — Encounter (HOSPITAL_COMMUNITY): Payer: Self-pay | Admitting: Obstetrics & Gynecology

## 2015-04-15 ENCOUNTER — Inpatient Hospital Stay (HOSPITAL_COMMUNITY)
Admission: AD | Admit: 2015-04-15 | Discharge: 2015-04-15 | Disposition: A | Discharge status: Home / Self Care | Payer: Medicaid Other | Source: Ambulatory Visit | Attending: Obstetrics & Gynecology | Admitting: Obstetrics & Gynecology

## 2015-04-15 DIAGNOSIS — O219 Vomiting of pregnancy, unspecified: Secondary | ICD-10-CM | POA: Diagnosis not present

## 2015-04-15 DIAGNOSIS — O210 Mild hyperemesis gravidarum: Secondary | ICD-10-CM | POA: Insufficient documentation

## 2015-04-15 DIAGNOSIS — R110 Nausea: Secondary | ICD-10-CM

## 2015-04-15 DIAGNOSIS — Z3A12 12 weeks gestation of pregnancy: Secondary | ICD-10-CM | POA: Diagnosis not present

## 2015-04-15 DIAGNOSIS — R630 Anorexia: Secondary | ICD-10-CM | POA: Diagnosis not present

## 2015-04-15 LAB — URINALYSIS, ROUTINE W REFLEX MICROSCOPIC
BILIRUBIN URINE: NEGATIVE
GLUCOSE, UA: NEGATIVE mg/dL
HGB URINE DIPSTICK: NEGATIVE
KETONES UR: NEGATIVE mg/dL
LEUKOCYTES UA: NEGATIVE
Nitrite: NEGATIVE
PROTEIN: NEGATIVE mg/dL
Specific Gravity, Urine: <1.005 — ABNORMAL LOW (ref 1.005–1.030)
pH: 7 (ref 5.0–8.0)

## 2015-04-15 MED ORDER — PRENATAL VITAMIN 27-0.8 MG PO TABS: 1.0000 | ORAL_TABLET | Freq: Every day | ORAL | Status: DC

## 2015-04-15 MED ORDER — METOCLOPRAMIDE HCL 10 MG PO TABS: 10.0000 mg | ORAL_TABLET | Freq: Three times a day (TID) | ORAL | Status: AC

## 2015-04-15 NOTE — MAU Note (Addendum)
Pt C/O nausea, doesn't have appetite, feeling weak.  Doesn't want to eat once she sees or smells food.  Denies vomiting.  Has intermittent mild lower back pain, isn't hurting right now.

## 2015-04-15 NOTE — MAU Provider Note (Signed)
S:  Jodi Gonzalez is a 20 y.o. female G1P0000 at [redacted]w[redacted]d presenting to MAU with nausea; decreased appetite.  She is having no vomiting. She denies vaginal bleeding. No other issues.  Patient is requesting a note from work.    O:  GENERAL: Well-developed, well-nourished female in no acute distress.  LUNGS: Effort normal SKIN: Warm, dry and without erythema PSYCH: Normal mood and affect  Filed Vitals:   04/15/15 1242  BP: 107/74  Pulse: 84  Temp: 97.4 F (36.3 C)  TempSrc: Oral  Resp: 18   Results for orders placed or performed during the hospital encounter of 04/15/15 (from the past 48 hour(s))  Urinalysis, Routine w reflex microscopic (not at [facility])     Status: Abnormal   Collection Time: 04/15/15 12:47 PM  Result Value Ref Range   Color, Urine YELLOW YELLOW   APPearance CLEAR CLEAR   Specific Gravity, Urine <1.005 (L) 1.005 - 1.030   pH 7.0 5.0 - 8.0   Glucose, UA NEGATIVE NEGATIVE mg/dL   Hgb urine dipstick NEGATIVE NEGATIVE   Bilirubin Urine NEGATIVE NEGATIVE   Ketones, ur NEGATIVE NEGATIVE mg/dL   Protein, ur NEGATIVE NEGATIVE mg/dL   Nitrite NEGATIVE NEGATIVE   Leukocytes, UA NEGATIVE NEGATIVE    Comment: MICROSCOPIC NOT DONE ON URINES WITH NEGATIVE PROTEIN, BLOOD, LEUKOCYTES, NITRITE, OR GLUCOSE <1000 mg/dL.    MDM + fetal heart tones.    A:  1. Nausea   2. Decreased appetite     P:  Discharge home in stable condition RX: Reglan  Patient requesting note for work; note given Small, frequent meals Avoid fried foods.    Duane Lope, NP 04/15/2015 1:56 PM

## 2015-04-15 NOTE — Discharge Instructions (Signed)

## 2015-04-16 ENCOUNTER — Ambulatory Visit (HOSPITAL_COMMUNITY)
Admission: RE | Admit: 2015-04-16 | Discharge: 2015-04-16 | Disposition: A | Discharge status: Home / Self Care | Payer: Medicaid Other | Source: Ambulatory Visit | Attending: Nurse Practitioner | Admitting: Nurse Practitioner

## 2015-04-16 ENCOUNTER — Ambulatory Visit (HOSPITAL_COMMUNITY): Admission: RE | Admit: 2015-04-16 | Payer: Medicaid Other | Source: Ambulatory Visit

## 2015-04-16 ENCOUNTER — Encounter (HOSPITAL_COMMUNITY): Payer: Self-pay

## 2015-04-16 DIAGNOSIS — Z3A13 13 weeks gestation of pregnancy: Secondary | ICD-10-CM | POA: Diagnosis not present

## 2015-04-16 DIAGNOSIS — Z36 Encounter for antenatal screening of mother: Secondary | ICD-10-CM | POA: Insufficient documentation

## 2015-04-16 DIAGNOSIS — Z3682 Encounter for antenatal screening for nuchal translucency: Secondary | ICD-10-CM

## 2015-04-16 IMAGING — US US MFM FETAL NUCHAL TRANSLUCENCY
1 series · 13 of 28 positions shown · non-contrast
Comparison: none

[Series 1: us mfm fetal nuchal translucency · 0.19mm/px · 13 of 30 slices shown]
[im 2/30]
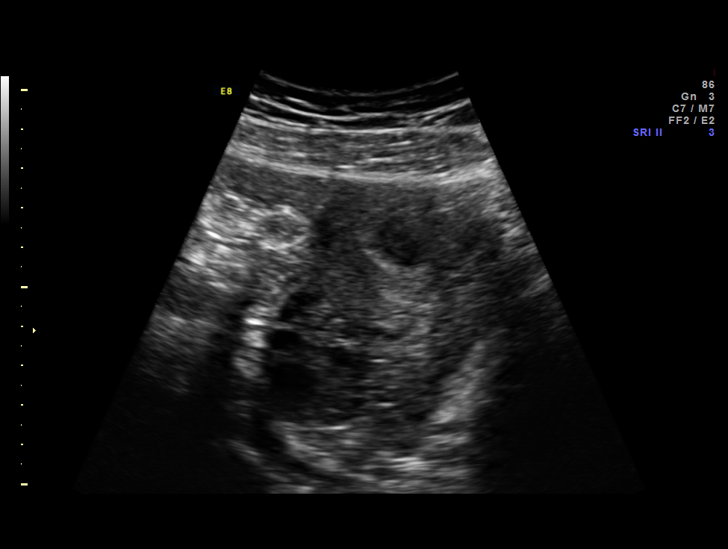
[im 4/30]
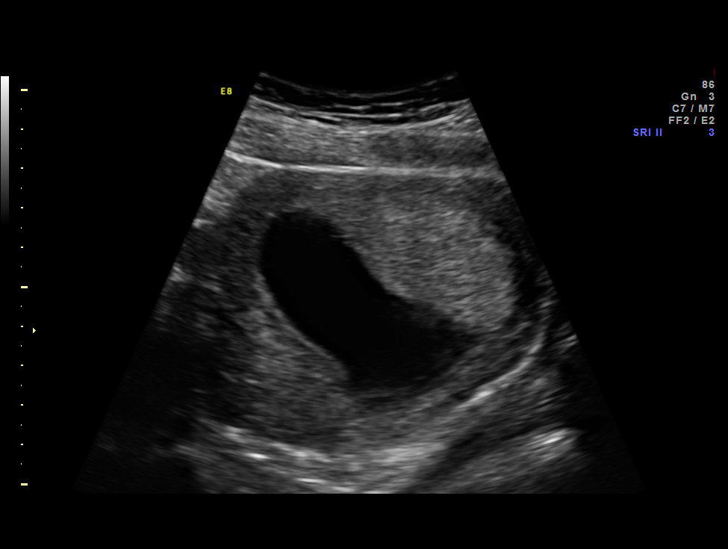
[im 6/30]
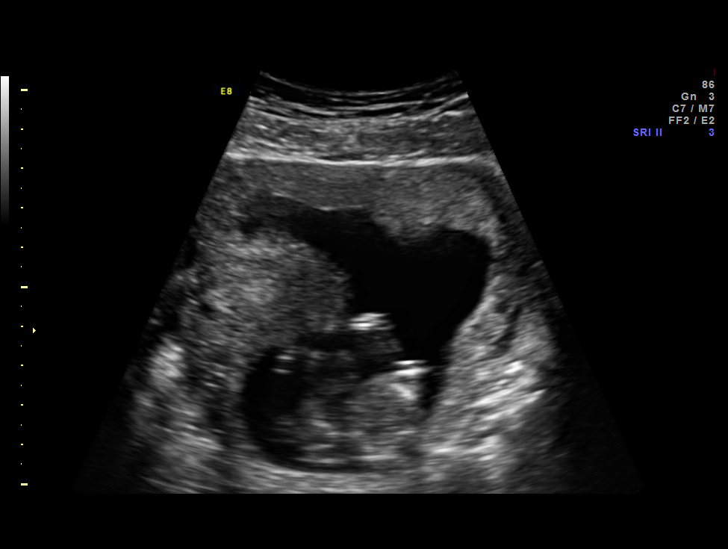
[im 8/30]
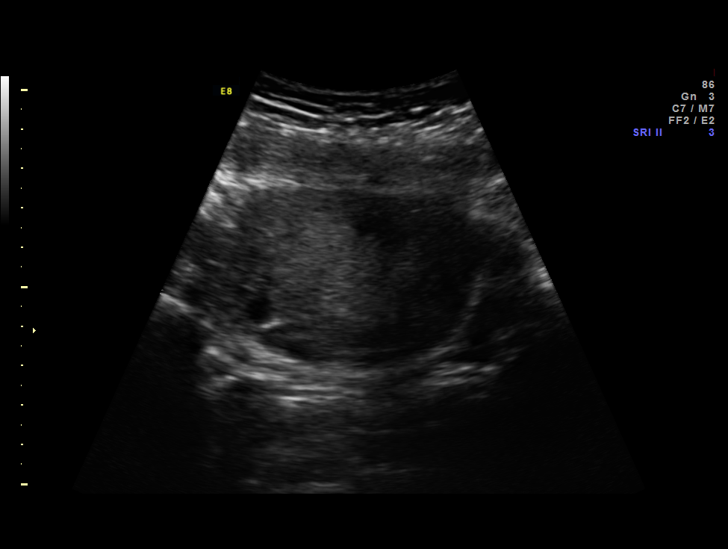
[im 10/30]
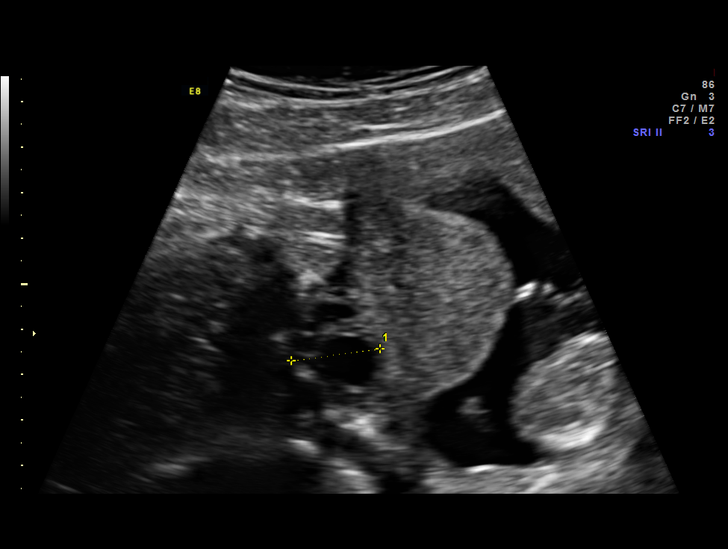
[im 12/30]
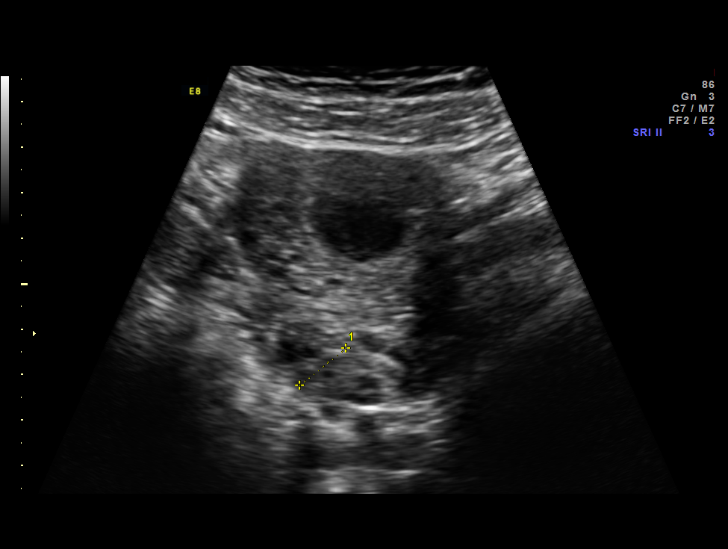
[im 16/30]
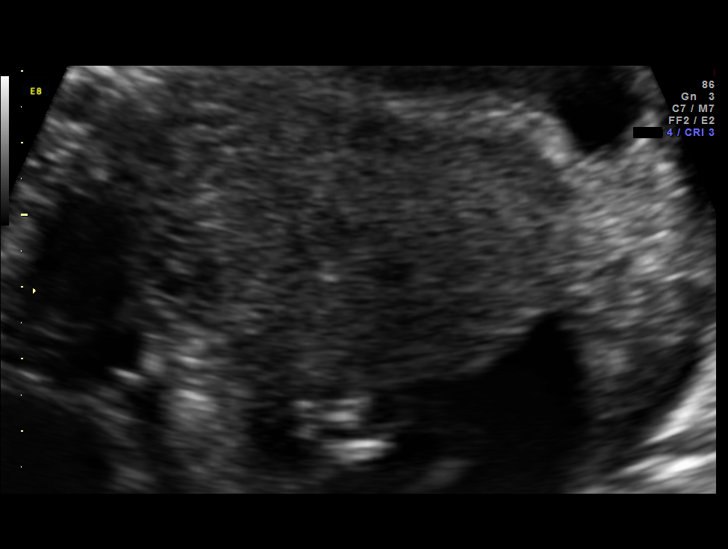
[im 18/30]
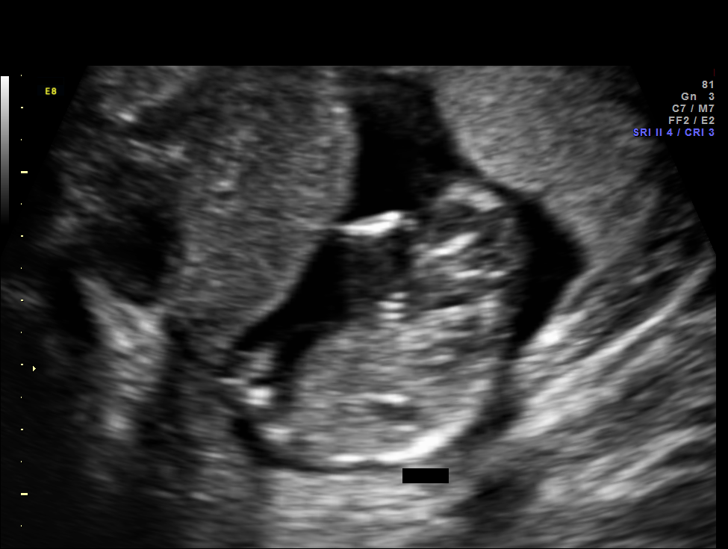
[im 20/30]
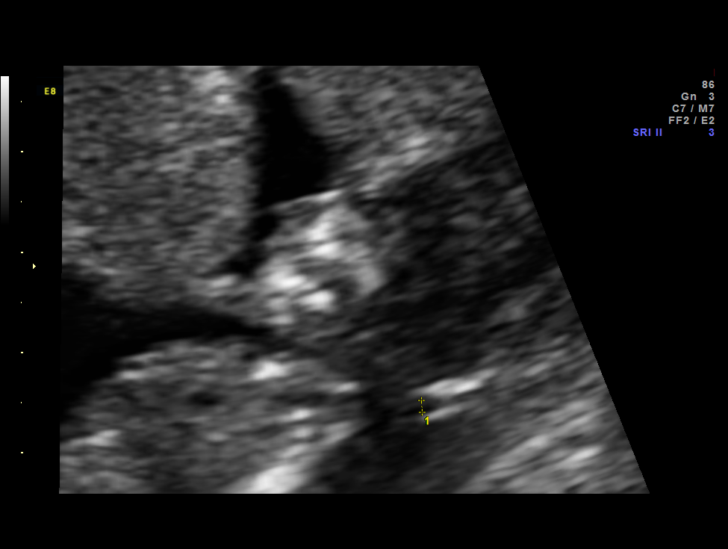
[im 22/30]
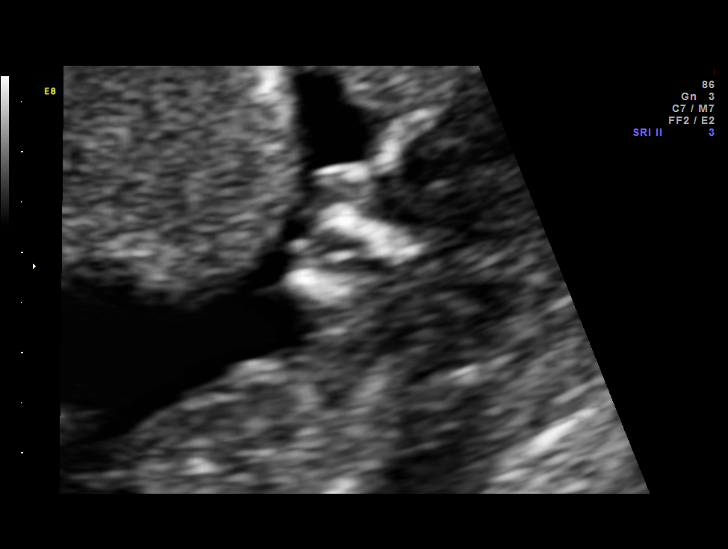
[im 24/30]
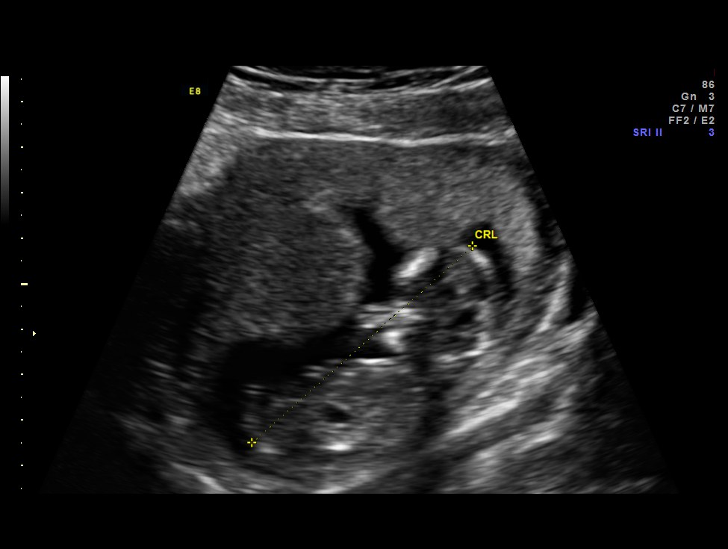
[im 26/30]
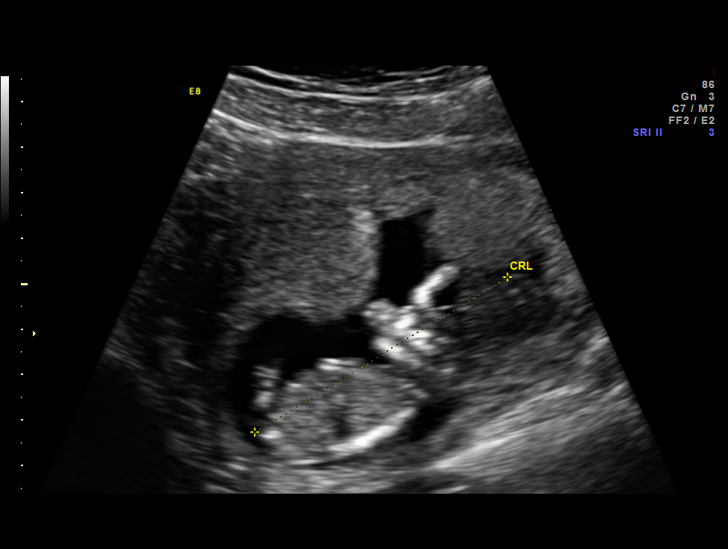
[im 28/30]
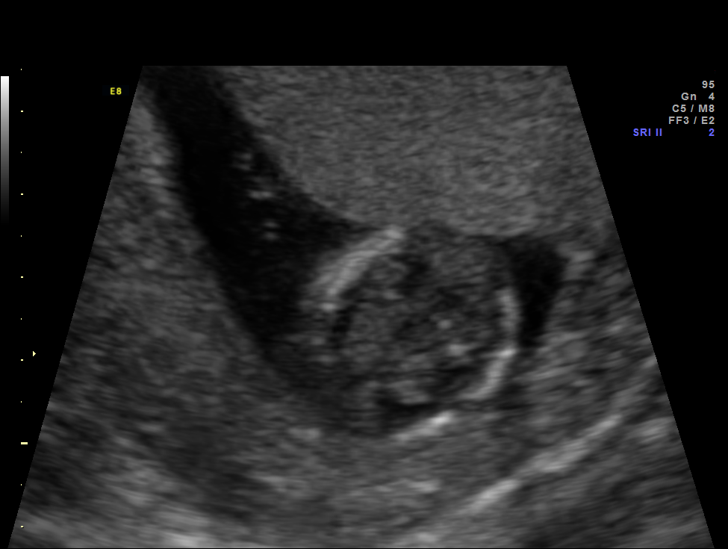

[13 of 28 positions shown; findings below may reference images not displayed]

Health
RYNE

TRANSLUCENCY

1  RYNE                [PHONE_NUMBER]      [PHONE_NUMBER]     [PHONE_NUMBER]
RYNE
Indications

13 weeks gestation of pregnancy
First trimester aneuploidy screen (NT)         Z36
OB History

Height:       5'3"    Weight:   173        BMI:
Gravidity:    1         Term:   0        Prem:   0         SAB:   0
TOP:          0       Ectopic:  0        Living: 0
Fetal Evaluation

Num Of Fetuses:     1
Preg. Location:     Intrauterine
Fetal Pole:         Visualized
Fetal Heart         169
Rate(bpm):
Cardiac Activity:   Observed

Amniotic Fluid
AFI FV:      Subjectively within normal limits
Biometry

CRL:      65.1  mm     G. Age:  12w 5d                   EDD:   [DATE]
Gestational Age

LMP:           12w 1d        Date:  [DATE]                 EDD:    [DATE]
Best:          13w 0d     Det. By:  Early Ultrasound         EDD:    [DATE]
([DATE])
1st Trimester Genetic Sonogram Screening

CRL:            65.1  mm    G. Age:   12w 5d                 EDD:    [DATE]

Nasal Bone:                 Present
Anatomy

Cranium:          Appears normal         Stomach:          Appears normal, left
sided
Choroid Plexus:   Appears normal
Impression

SIUP at 13+0 weeks
No gross abnormalities identified
NT measurement was within normal limits for this GA; NB
present
Normal amniotic fluid volume
Measurements consistent with prior US

After counseling, Ms. RYNE decided not to have first
trimester screening for aneuploidy. Quad screen was
reviewed with her as an option if she changes her mind.
Recommendations

Offer [facility] in the second trimester for ONTD screening
Offer anatomy U/S by 18 weeks

## 2015-04-19 ENCOUNTER — Other Ambulatory Visit (HOSPITAL_COMMUNITY): Payer: Self-pay | Admitting: *Deleted

## 2015-04-19 DIAGNOSIS — Z0489 Encounter for examination and observation for other specified reasons: Secondary | ICD-10-CM

## 2015-04-19 DIAGNOSIS — IMO0002 Reserved for concepts with insufficient information to code with codable children: Secondary | ICD-10-CM

## 2015-04-30 ENCOUNTER — Encounter (HOSPITAL_COMMUNITY): Payer: Self-pay | Admitting: *Deleted

## 2015-04-30 ENCOUNTER — Inpatient Hospital Stay (HOSPITAL_COMMUNITY)
Admission: AD | Admit: 2015-04-30 | Discharge: 2015-04-30 | Disposition: A | Discharge status: Home / Self Care | Payer: Medicaid Other | Source: Ambulatory Visit | Attending: Obstetrics & Gynecology | Admitting: Obstetrics & Gynecology

## 2015-04-30 DIAGNOSIS — R1030 Lower abdominal pain, unspecified: Secondary | ICD-10-CM | POA: Insufficient documentation

## 2015-04-30 DIAGNOSIS — O26893 Other specified pregnancy related conditions, third trimester: Secondary | ICD-10-CM | POA: Diagnosis not present

## 2015-04-30 DIAGNOSIS — Z3A15 15 weeks gestation of pregnancy: Secondary | ICD-10-CM | POA: Insufficient documentation

## 2015-04-30 DIAGNOSIS — M549 Dorsalgia, unspecified: Secondary | ICD-10-CM

## 2015-04-30 DIAGNOSIS — O9989 Other specified diseases and conditions complicating pregnancy, childbirth and the puerperium: Secondary | ICD-10-CM

## 2015-04-30 DIAGNOSIS — O26892 Other specified pregnancy related conditions, second trimester: Secondary | ICD-10-CM | POA: Diagnosis present

## 2015-04-30 HISTORY — DX: Other specified health status: Z78.9

## 2015-04-30 LAB — URINALYSIS, ROUTINE W REFLEX MICROSCOPIC
Bilirubin Urine: NEGATIVE
GLUCOSE, UA: NEGATIVE mg/dL
HGB URINE DIPSTICK: NEGATIVE
Ketones, ur: NEGATIVE mg/dL
LEUKOCYTES UA: NEGATIVE
Nitrite: NEGATIVE
PH: 6 (ref 5.0–8.0)
Protein, ur: NEGATIVE mg/dL
Specific Gravity, Urine: 1.025 (ref 1.005–1.030)

## 2015-04-30 MED ORDER — CYCLOBENZAPRINE HCL 10 MG PO TABS: 10.0000 mg | ORAL_TABLET | Freq: Two times a day (BID) | ORAL | Status: AC | PRN

## 2015-04-30 NOTE — Discharge Instructions (Signed)
Back Exercises °The following exercises strengthen the muscles that help to support the back. They also help to keep the lower back flexible. Doing these exercises can help to prevent back pain or lessen existing pain. °If you have back pain or discomfort, try doing these exercises 2-3 times each day or as told by your health care provider. When the pain goes away, do them once each day, but increase the number of times that you repeat the steps for each exercise (do more repetitions). If you do not have back pain or discomfort, do these exercises once each day or as told by your health care provider. °EXERCISES °Single Knee to Chest °Repeat these steps 3-5 times for each leg: °· Lie on your back on a firm bed or the floor with your legs extended. °· Bring one knee to your chest. Your other leg should stay extended and in contact with the floor. °· Hold your knee in place by grabbing your knee or thigh. °· Pull on your knee until you feel a gentle stretch in your lower back. °· Hold the stretch for 10-30 seconds. °· Slowly release and straighten your leg. °Pelvic Tilt °Repeat these steps 5-10 times: °· Lie on your back on a firm bed or the floor with your legs extended. °· Bend your knees so they are pointing toward the ceiling and your feet are flat on the floor. °· Tighten your lower abdominal muscles to press your lower back against the floor. This motion will tilt your pelvis so your tailbone points up toward the ceiling instead of pointing to your feet or the floor. °· With gentle tension and even breathing, hold this position for 5-10 seconds. °Cat-Cow °Repeat these steps until your lower back becomes more flexible: °· Get into a hands-and-knees position on a firm surface. Keep your hands under your shoulders, and keep your knees under your hips. You may place padding under your knees for comfort. °· Let your head hang down, and point your tailbone toward the floor so your lower back becomes rounded like the  back of a cat. °· Hold this position for 5 seconds. °· Slowly lift your head and point your tailbone up toward the ceiling so your back forms a sagging arch like the back of a cow. °· Hold this position for 5 seconds. °Press-Ups °Repeat these steps 5-10 times: °1. Lie on your abdomen (face-down) on the floor. °2. Place your palms near your head, about shoulder-width apart. °3. While you keep your back as relaxed as possible and keep your hips on the floor, slowly straighten your arms to raise the top half of your body and lift your shoulders. Do not use your back muscles to raise your upper torso. You may adjust the placement of your hands to make yourself more comfortable. °4. Hold this position for 5 seconds while you keep your back relaxed. °5. Slowly return to lying flat on the floor. °Bridges °Repeat these steps 10 times: °1. Lie on your back on a firm surface. °2. Bend your knees so they are pointing toward the ceiling and your feet are flat on the floor. °3. Tighten your buttocks muscles and lift your buttocks off of the floor until your waist is at almost the same height as your knees. You should feel the muscles working in your buttocks and the back of your thighs. If you do not feel these muscles, slide your feet 1-2 inches farther away from your buttocks. °4. Hold this position for 3-5   seconds. 5. Slowly lower your hips to the starting position, and allow your buttocks muscles to relax completely. If this exercise is too easy, try doing it with your arms crossed over your chest. Abdominal Crunches Repeat these steps 5-10 times: 1. Lie on your back on a firm bed or the floor with your legs extended. 2. Bend your knees so they are pointing toward the ceiling and your feet are flat on the floor. 3. Cross your arms over your chest. 4. Tip your chin slightly toward your chest without bending your neck. 5. Tighten your abdominal muscles and slowly raise your trunk (torso) high enough to lift your  shoulder blades a tiny bit off of the floor. Avoid raising your torso higher than that, because it can put too much stress on your low back and it does not help to strengthen your abdominal muscles. 6. Slowly return to your starting position. Back Lifts Repeat these steps 5-10 times: 1. Lie on your abdomen (face-down) with your arms at your sides, and rest your forehead on the floor. 2. Tighten the muscles in your legs and your buttocks. 3. Slowly lift your chest off of the floor while you keep your hips pressed to the floor. Keep the back of your head in line with the curve in your back. Your eyes should be looking at the floor. 4. Hold this position for 3-5 seconds. 5. Slowly return to your starting position. SEEK MEDICAL CARE IF:  Your back pain or discomfort gets much worse when you do an exercise.  Your back pain or discomfort does not lessen within 2 hours after you exercise. If you have any of these problems, stop doing these exercises right away. Do not do them again unless your health care provider says that you can. SEEK IMMEDIATE MEDICAL CARE IF:  You develop sudden, severe back pain. If this happens, stop doing the exercises right away. Do not do them again unless your health care provider says that you can.   This information is not intended to replace advice given to you by your health care provider. Make sure you discuss any questions you have with your health care provider.   Document Released: 03/09/2004 Document Revised: 10/21/2014 Document Reviewed: 03/26/2014 Elsevier Interactive Patient Education 2016 Else[employer] Inc[employer]  Abdominal Pain During Pregnancy Abdominal pain is common in pregnancy. Most of the time, it does not cause harm. There are many causes of abdominal pain. Some causes are more serious than others. Some of the causes of abdominal pain in pregnancy are easily diagnosed. Occasionally, the diagnosis takes time to understand. Other times, the cause is not  determined. Abdominal pain can be a sign that something is very wrong with the pregnancy, or the pain may have nothing to do with the pregnancy at all. For this reason, always tell your health care provider if you have any abdominal discomfort. HOME CARE INSTRUCTIONS  Monitor your abdominal pain for any changes. The following actions may help to alleviate any discomfort you are experiencing:  Do not have sexual intercourse or put anything in your vagina until your symptoms go away completely.  Get plenty of rest until your pain improves.  Drink clear fluids if you feel nauseous. Avoid solid food as long as you are uncomfortable or nauseous.  Only take over-the-counter or prescription medicine as directed by your health care provider.  Keep all follow-up appointments with your health care provider. SEEK IMMEDIATE MEDICAL CARE IF:  You are bleeding, leaking fluid, or passing tissue  from the vagina.  You have increasing pain or cramping.  You have persistent vomiting.  You have painful or bloody urination.  You have a fever.  You notice a decrease in your baby's movements.  You have extreme weakness or feel faint.  You have shortness of breath, with or without abdominal pain.  You develop a severe headache with abdominal pain.  You have abnormal vaginal discharge with abdominal pain.  You have persistent diarrhea.  You have abdominal pain that continues even after rest, or gets worse. MAKE SURE YOU:   Understand these instructions.  Will watch your condition.  Will get help right away if you are not doing well or get worse.   This information is not intended to replace advice given to you by your health care provider. Make sure you discuss any questions you have with your health care provider.   Document Released: 01/30/2005 Document Revised: 11/20/2012 Document Reviewed: 08/29/2012 Elsevier Interactive Patient Education [employer]

## 2015-04-30 NOTE — MAU Note (Signed)
Lower abdominal pain and back pain had to leave work today because of pain, onset 2 weeks

## 2015-05-18 ENCOUNTER — Ambulatory Visit (HOSPITAL_COMMUNITY): Payer: Medicaid Other | Attending: Maternal and Fetal Medicine

## 2015-05-21 ENCOUNTER — Ambulatory Visit (HOSPITAL_COMMUNITY): Payer: Medicaid Other

## 2015-06-10 ENCOUNTER — Encounter: Payer: Self-pay | Admitting: Obstetrics & Gynecology

## 2015-06-10 DIAGNOSIS — O219 Vomiting of pregnancy, unspecified: Secondary | ICD-10-CM | POA: Insufficient documentation

## 2015-12-23 ENCOUNTER — Encounter (HOSPITAL_COMMUNITY): Payer: Self-pay
